# Patient Record
Sex: Female | Born: 1990 | Race: White | Hispanic: No | Smoking: Never smoker
Health system: Southern US, Community
[De-identification: ages and names within clinical notes are randomized; demographics above are authoritative.]

---

## 2009-09-17 ENCOUNTER — Emergency Department: Payer: Self-pay | Admitting: Emergency Medicine

## 2011-06-06 ENCOUNTER — Inpatient Hospital Stay: Payer: Self-pay

## 2017-02-10 ENCOUNTER — Emergency Department: Payer: No Typology Code available for payment source

## 2017-02-10 ENCOUNTER — Emergency Department
Admission: EM | Admit: 2017-02-10 | Discharge: 2017-02-10 | Disposition: A | Discharge status: Home / Self Care | Payer: No Typology Code available for payment source | Attending: Emergency Medicine | Admitting: Emergency Medicine

## 2017-02-10 ENCOUNTER — Encounter: Payer: Self-pay | Admitting: Emergency Medicine

## 2017-02-10 DIAGNOSIS — M7918 Myalgia, other site: Secondary | ICD-10-CM

## 2017-02-10 DIAGNOSIS — M791 Myalgia: Secondary | ICD-10-CM | POA: Insufficient documentation

## 2017-02-10 DIAGNOSIS — Y9389 Activity, other specified: Secondary | ICD-10-CM | POA: Insufficient documentation

## 2017-02-10 DIAGNOSIS — Y92410 Unspecified street and highway as the place of occurrence of the external cause: Secondary | ICD-10-CM | POA: Diagnosis not present

## 2017-02-10 DIAGNOSIS — Y998 Other external cause status: Secondary | ICD-10-CM | POA: Diagnosis not present

## 2017-02-10 DIAGNOSIS — N644 Mastodynia: Secondary | ICD-10-CM | POA: Diagnosis present

## 2017-02-10 LAB — POCT PREGNANCY, URINE: Preg Test, Ur: NEGATIVE

## 2017-02-10 MED ORDER — HYDROCODONE-ACETAMINOPHEN 5-325 MG PO TABS
ORAL_TABLET | ORAL | Status: AC
Start: 2017-02-10 — End: 2017-02-10
  Administered 2017-02-10: 1 via ORAL
  Filled 2017-02-10: qty 1

## 2017-02-10 MED ORDER — NAPROXEN 500 MG PO TABS: 500.0000 mg | ORAL_TABLET | Freq: Two times a day (BID) | ORAL | 0 refills | Status: AC

## 2017-02-10 MED ORDER — CYCLOBENZAPRINE HCL 5 MG PO TABS: 5.0000 mg | ORAL_TABLET | Freq: Three times a day (TID) | ORAL | 0 refills | Status: AC | PRN

## 2017-02-10 MED ORDER — HYDROCODONE-ACETAMINOPHEN 5-325 MG PO TABS
1.0000 | ORAL_TABLET | Freq: Once | ORAL | Status: AC
  Administered 2017-02-10: 1 via ORAL
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
## 2024-08-13 ENCOUNTER — Other Ambulatory Visit: Payer: Self-pay

## 2024-08-13 ENCOUNTER — Emergency Department: Admission: EM | Admit: 2024-08-13 | Discharge: 2024-08-13 | Disposition: A | Discharge status: Home / Self Care

## 2024-08-13 ENCOUNTER — Emergency Department

## 2024-08-13 DIAGNOSIS — M25552 Pain in left hip: Secondary | ICD-10-CM

## 2024-08-13 LAB — POC URINE PREG, ED: Preg Test, Ur: NEGATIVE

## 2024-08-13 MED ORDER — METHOCARBAMOL 500 MG PO TABS
1000.0000 mg | ORAL_TABLET | Freq: Once | ORAL | Status: AC
  Administered 2024-08-13: 1000 mg via ORAL
  Filled 2024-08-13: qty 2

## 2024-08-13 MED ORDER — MELOXICAM 15 MG PO TABS: 15.0000 mg | ORAL_TABLET | Freq: Every day | ORAL | 0 refills | Status: AC

## 2024-08-13 MED ORDER — METHOCARBAMOL 500 MG PO TABS: 1000.0000 mg | ORAL_TABLET | Freq: Three times a day (TID) | ORAL | 0 refills | Status: AC | PRN

## 2024-08-13 MED ORDER — IBUPROFEN 600 MG PO TABS
600.0000 mg | ORAL_TABLET | Freq: Once | ORAL | Status: AC
  Administered 2024-08-13: 600 mg via ORAL
  Filled 2024-08-13: qty 1
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
